# Patient Record
Sex: Female | Born: 1950 | Race: White | Hispanic: No | Smoking: Current every day smoker
Health system: Southern US, Community
[De-identification: ages and names within clinical notes are randomized; demographics above are authoritative.]

## PROBLEM LIST (undated history)

## (undated) DIAGNOSIS — J8410 Pulmonary fibrosis, unspecified: Secondary | ICD-10-CM

## (undated) DIAGNOSIS — M810 Age-related osteoporosis without current pathological fracture: Secondary | ICD-10-CM

---

## 2017-07-16 ENCOUNTER — Emergency Department (HOSPITAL_BASED_OUTPATIENT_CLINIC_OR_DEPARTMENT_OTHER): Payer: Non-veteran care

## 2017-07-16 ENCOUNTER — Encounter (HOSPITAL_BASED_OUTPATIENT_CLINIC_OR_DEPARTMENT_OTHER): Payer: Self-pay | Admitting: Emergency Medicine

## 2017-07-16 ENCOUNTER — Emergency Department (HOSPITAL_BASED_OUTPATIENT_CLINIC_OR_DEPARTMENT_OTHER)
Admission: EM | Admit: 2017-07-16 | Discharge: 2017-07-16 | Disposition: A | Discharge status: Home / Self Care | Payer: Non-veteran care | Attending: Emergency Medicine | Admitting: Emergency Medicine

## 2017-07-16 DIAGNOSIS — S0081XA Abrasion of other part of head, initial encounter: Secondary | ICD-10-CM | POA: Diagnosis not present

## 2017-07-16 DIAGNOSIS — Y929 Unspecified place or not applicable: Secondary | ICD-10-CM | POA: Insufficient documentation

## 2017-07-16 DIAGNOSIS — S80212A Abrasion, left knee, initial encounter: Secondary | ICD-10-CM | POA: Insufficient documentation

## 2017-07-16 DIAGNOSIS — Y999 Unspecified external cause status: Secondary | ICD-10-CM | POA: Insufficient documentation

## 2017-07-16 DIAGNOSIS — S80211A Abrasion, right knee, initial encounter: Secondary | ICD-10-CM | POA: Insufficient documentation

## 2017-07-16 DIAGNOSIS — Y9389 Activity, other specified: Secondary | ICD-10-CM | POA: Insufficient documentation

## 2017-07-16 DIAGNOSIS — R569 Unspecified convulsions: Secondary | ICD-10-CM | POA: Insufficient documentation

## 2017-07-16 DIAGNOSIS — S60511A Abrasion of right hand, initial encounter: Secondary | ICD-10-CM | POA: Insufficient documentation

## 2017-07-16 DIAGNOSIS — F17210 Nicotine dependence, cigarettes, uncomplicated: Secondary | ICD-10-CM | POA: Diagnosis not present

## 2017-07-16 DIAGNOSIS — W01198A Fall on same level from slipping, tripping and stumbling with subsequent striking against other object, initial encounter: Secondary | ICD-10-CM | POA: Diagnosis not present

## 2017-07-16 DIAGNOSIS — S40811A Abrasion of right upper arm, initial encounter: Secondary | ICD-10-CM | POA: Insufficient documentation

## 2017-07-16 DIAGNOSIS — F10920 Alcohol use, unspecified with intoxication, uncomplicated: Secondary | ICD-10-CM

## 2017-07-16 DIAGNOSIS — S0990XA Unspecified injury of head, initial encounter: Secondary | ICD-10-CM

## 2017-07-16 HISTORY — DX: Pulmonary fibrosis, unspecified: J84.10

## 2017-07-16 HISTORY — DX: Age-related osteoporosis without current pathological fracture: M81.0

## 2017-07-16 LAB — CBC WITH DIFFERENTIAL/PLATELET
BASOS ABS: 0 10*3/uL (ref 0.0–0.1)
BASOS PCT: 0 %
EOS ABS: 0.1 10*3/uL (ref 0.0–0.7)
EOS PCT: 1 %
HCT: 35.4 % — ABNORMAL LOW (ref 36.0–46.0)
HEMOGLOBIN: 12.2 g/dL (ref 12.0–15.0)
Lymphocytes Relative: 23 %
Lymphs Abs: 2.2 10*3/uL (ref 0.7–4.0)
MCH: 30.6 pg (ref 26.0–34.0)
MCHC: 34.5 g/dL (ref 30.0–36.0)
MCV: 88.7 fL (ref 78.0–100.0)
MONO ABS: 0.6 10*3/uL (ref 0.1–1.0)
Monocytes Relative: 6 %
Neutro Abs: 6.4 10*3/uL (ref 1.7–7.7)
Neutrophils Relative %: 70 %
Platelets: 239 10*3/uL (ref 150–400)
RBC: 3.99 MIL/uL (ref 3.87–5.11)
RDW: 15.6 % — ABNORMAL HIGH (ref 11.5–15.5)
WBC: 9.2 10*3/uL (ref 4.0–10.5)

## 2017-07-16 LAB — COMPREHENSIVE METABOLIC PANEL
ALT: 20 U/L (ref 14–54)
ANION GAP: 9 (ref 5–15)
AST: 30 U/L (ref 15–41)
Albumin: 4.1 g/dL (ref 3.5–5.0)
Alkaline Phosphatase: 94 U/L (ref 38–126)
BILIRUBIN TOTAL: 0.4 mg/dL (ref 0.3–1.2)
BUN: 7 mg/dL (ref 6–20)
CO2: 25 mmol/L (ref 22–32)
Calcium: 8.5 mg/dL — ABNORMAL LOW (ref 8.9–10.3)
Chloride: 105 mmol/L (ref 101–111)
Creatinine, Ser: 0.77 mg/dL (ref 0.44–1.00)
GFR calc Af Amer: >60 mL/min (ref >60)
Glucose, Bld: 92 mg/dL (ref 65–99)
POTASSIUM: 3.8 mmol/L (ref 3.5–5.1)
SODIUM: 139 mmol/L (ref 135–145)
TOTAL PROTEIN: 7 g/dL (ref 6.5–8.1)

## 2017-07-16 LAB — ETHANOL: Alcohol, Ethyl (B): 148 mg/dL — ABNORMAL HIGH (ref <5)

## 2017-07-16 NOTE — Discharge Instructions (Signed)
Limit your alcohol consumption.  Follow-up with your primary doctor later this week, and return to the ER if symptoms significantly worsen or change.

## 2017-07-16 NOTE — ED Provider Notes (Signed)
[facility]   CSN: [id number] Arrival date & time: 07/16/17  1102     History   Chief Complaint Chief Complaint  Patient presents with  . Fall    HPI Debra Arellano is a 66 y.o. female.  Patient is a [age] year old female with history of pulmonary fibrosis. She presents for evaluation of a fall and possible seizure activity. She was celebrating her birthday yesterday evening and reports consuming several glasses of wine on an empty stomach. Yesterday evening she was outside smoking a cigarette while intoxicated, then fell forward and struck her face on the ground. She required assistance from her daughter to stand and walk back to the house. This morning her daughter was checking on her when she believes she witnessed her mother having a seizure. She had generalized shaking for approximately 30 seconds along with loss of consciousness and urinary incontinence for approximately 10 minutes. The daughter called EMS and the patient was transported here.  The patient replies "I like my wine" when asked how much alcohol she consumes. According to the daughter she is a significant drinker.   The history is provided by the patient.  Fall  This is a new problem. Episode onset: Last night. Episode frequency: Once. The problem has not changed since onset.Nothing aggravates the symptoms. Nothing relieves the symptoms. She has tried nothing for the symptoms.    Past Medical History:  Diagnosis Date  . Osteoporosis    of spine   . Pulmonary fibrosis (HCC)     There are no active problems to display for this patient.   History reviewed. No pertinent surgical history.  OB History    No data available       Home Medications    Prior to Admission medications   Not on File    Family History History reviewed. No pertinent family history.  Social History Social History  Substance Use Topics  . Smoking status: Current Every Day Smoker    Packs/day: 0.25   Years: 40.00    Types: Cigarettes  . Smokeless tobacco: Not on file  . Alcohol use 1.2 oz/week    2 Glasses of wine per week     Comment: 1-2 glasses of red wine daily      Allergies   Patient has no known allergies.   Review of Systems Review of Systems  All other systems reviewed and are negative.    Physical Exam Updated Vital Signs BP 120/77 (BP Location: Right Arm)   Pulse 82   Temp 98.9 F (37.2 C) (Oral)   Resp 18   Ht 5' 4.75" (1.645 m)   Wt 57.6 kg (127 lb)   SpO2 98%   BMI 21.30 kg/m   Physical Exam  Constitutional: She is oriented to person, place, and time. She appears well-developed and well-nourished. No distress.  HENT:  Head: Normocephalic.  There are abrasions to the right supraorbital ridge and cheekbone, however no significant swelling or obvious deformity. TMs are clear bilaterally without hemotympanum.  Eyes: Pupils are equal, round, and reactive to light. EOM are normal.  There is no diplopia on upward case.  Neck: Normal range of motion. Neck supple.  Cardiovascular: Normal rate and regular rhythm.  Exam reveals no gallop and no friction rub.   No murmur heard. Pulmonary/Chest: Effort normal and breath sounds normal. No respiratory distress. She has no wheezes.  Abdominal: Soft. Bowel sounds are normal. She exhibits no distension. There is no tenderness.  Musculoskeletal: Normal range  of motion.  Neurological: She is alert and oriented to person, place, and time. No cranial nerve deficit. She exhibits normal muscle tone. Coordination normal.  Skin: Skin is warm and dry. She is not diaphoretic.  Nursing note and vitals reviewed.    ED Treatments / Results  Labs (all labs ordered are listed, but only abnormal results are displayed) Labs Reviewed  CBC WITH DIFFERENTIAL/PLATELET  COMPREHENSIVE METABOLIC PANEL  ETHANOL    EKG  EKG Interpretation None       Radiology No results found.  Procedures Procedures (including critical  care time)  Medications Ordered in ED Medications - No data to display   Initial Impression / Assessment and Plan / ED Course  I have reviewed the triage vital signs and the nursing notes.  Pertinent labs & imaging results that were available during my care of the patient were reviewed by me and considered in my medical decision making (see chart for details).  Patient brought for evaluation after apparent seizure. She has no history of this, however does have a history of alcohol consumption. She was drinking heavily yesterday evening, then experienced what sounds like a seizure today. She is now wide awake and neurologically intact. Her workup today reveals no acute abnormality with the exception of a blood alcohol of 148. I suspect the patient was quite intoxicated yesterday evening and believe whatever episode happened this morning is related to alcohol. I have advised the patient to refrain from heavy drinking and follow-up with her primary doctor later this week.  Final Clinical Impressions(s) / ED Diagnoses   Final diagnoses:  None    New Prescriptions New Prescriptions   No medications on file     Geoffery Lyonselo, Seri Kimmer, MD 07/16/17 1321

## 2017-07-16 NOTE — ED Triage Notes (Signed)
Per EMS: PT had 4 glasses of wine last night for birthday celebration and was outside and fell flat on face onto concrete.  Pt has bruising to right side of face and abrasion to right hand, bilateral knees and right arm.  Pt daughter gave pt pedialyte this morning and pt "started shaking" for less than 10 seconds and was unconscious for appx 30 seconds.  Pt had episode of incontinence.  Pt alert and oriented at this time.

## 2017-07-16 NOTE — ED Notes (Signed)
Side rails padded with seizure pads.  Pt alert and oriented at this time.

## 2018-12-04 IMAGING — CT CT HEAD W/O CM
3 series · 15 of 47 positions shown, 18 images · non-contrast
Comparison: None.

CLINICAL DATA: Status post fall last night with a blow to the face.
Initial encounter.

EXAM:
CT HEAD WITHOUT CONTRAST
TECHNIQUE: Contiguous axial images were obtained from the base of the skull
through the vertex without intravenous contrast.

[Series 2: head wo · axial · 0.49mm/px · z∈[-168,-43]mm · 9 of 30 slices shown, 12 images]
[im 3/30  brain]
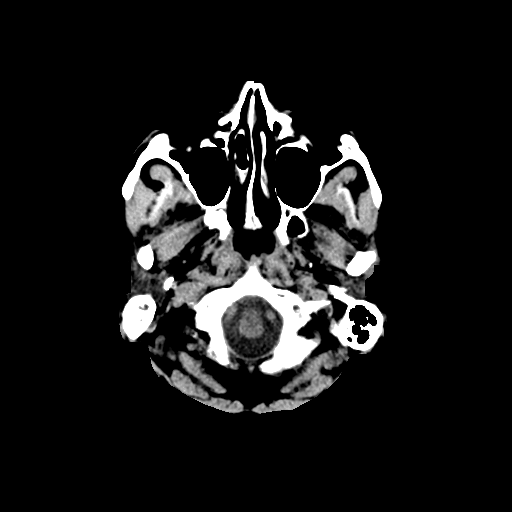
[im 3/30  bone]
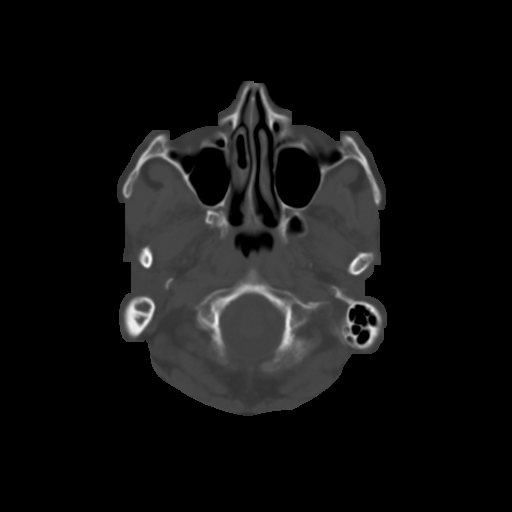
[im 6/30  brain]
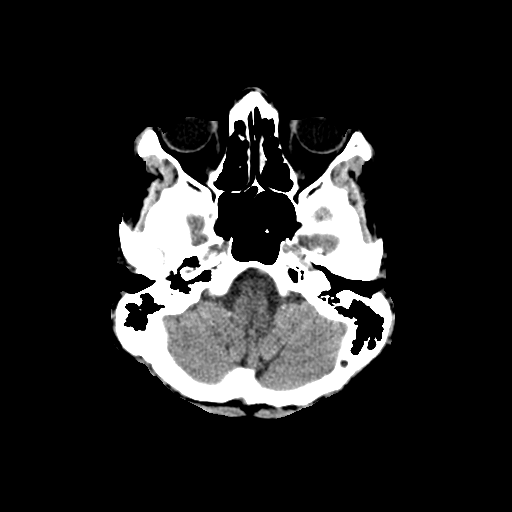
[im 9/30  brain]
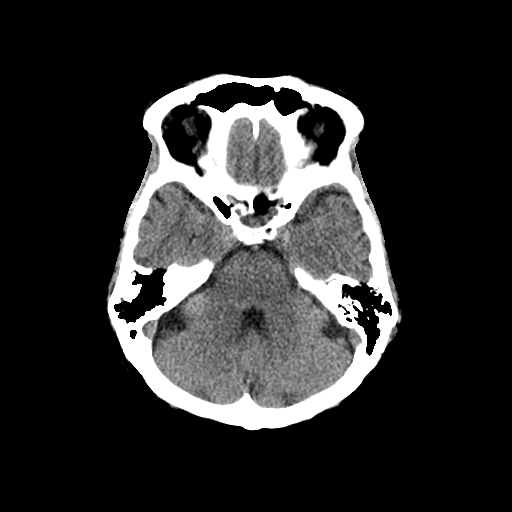
[im 12/30  brain]
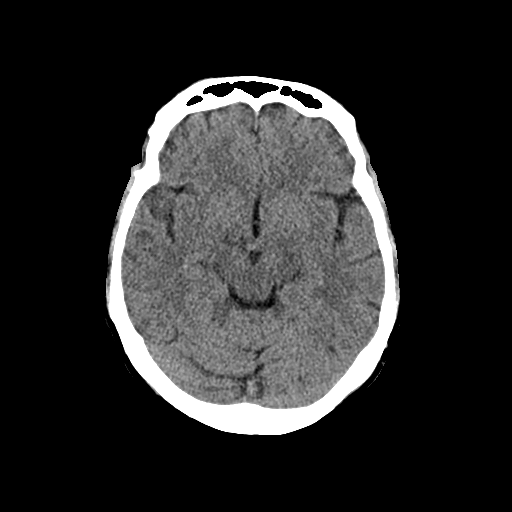
[im 16/30  brain]
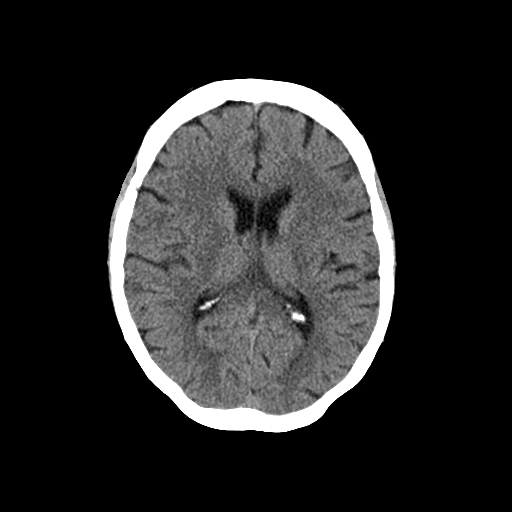
[im 16/30  bone]
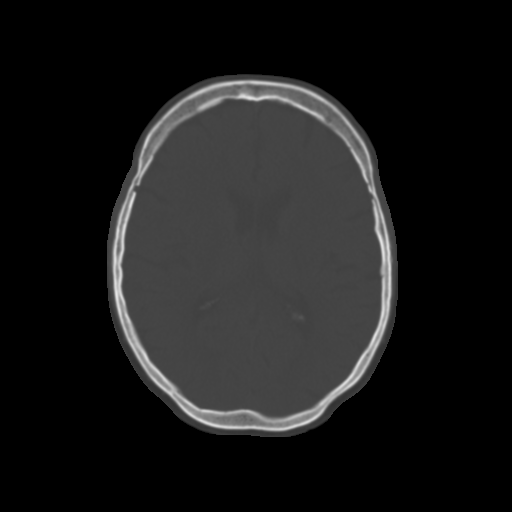
[im 19/30  brain]
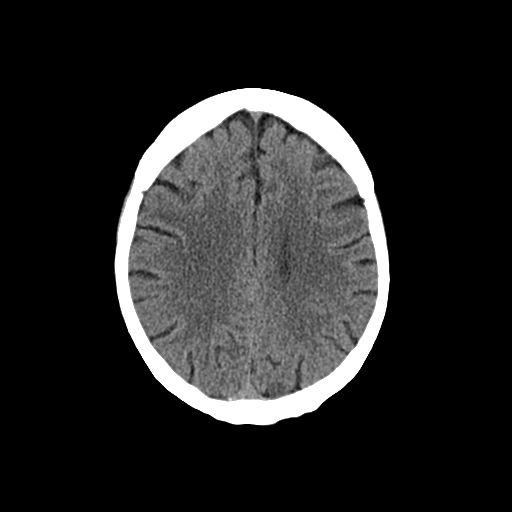
[im 22/30  brain]
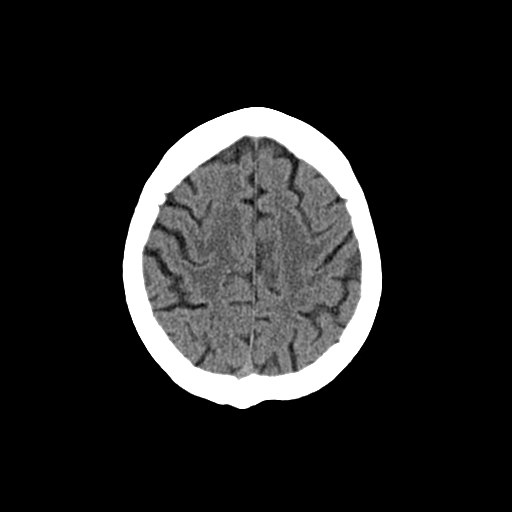
[im 25/30  brain]
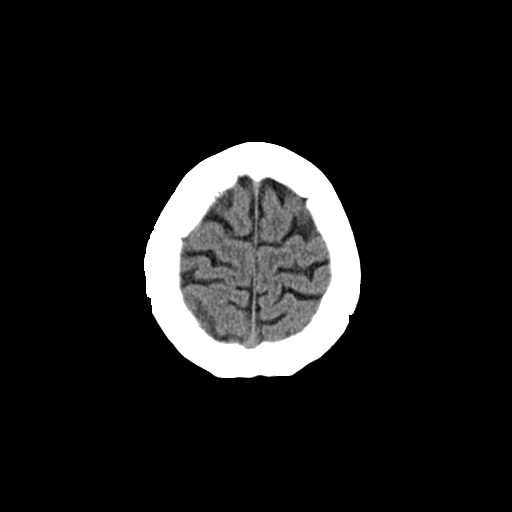
[im 28/30  brain]
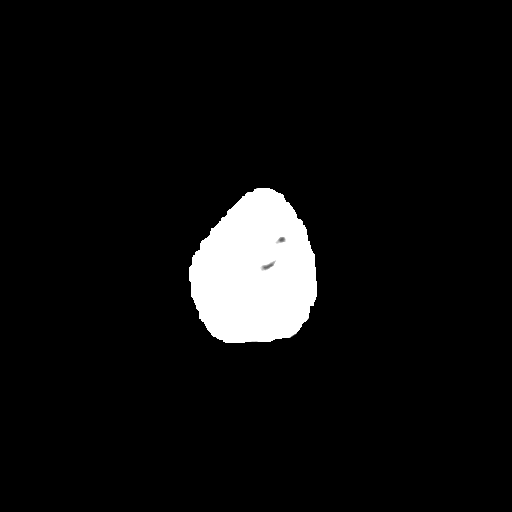
[im 28/30  bone]
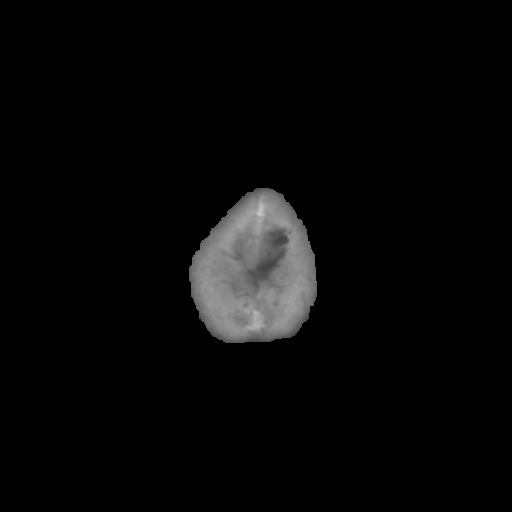

[Series 4: cor soft · coronal · 0.29mm/px · 3 of 60 slices shown]
[im 20/60  brain]
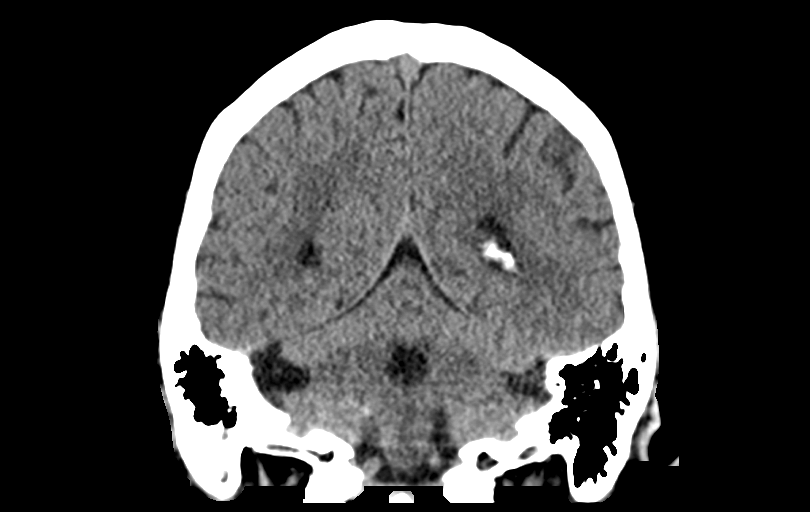
[im 27/60  brain]
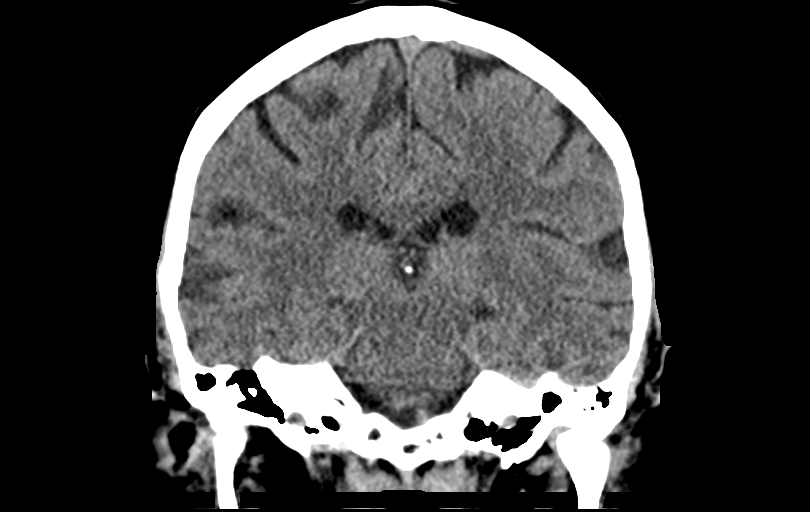
[im 33/60  brain]
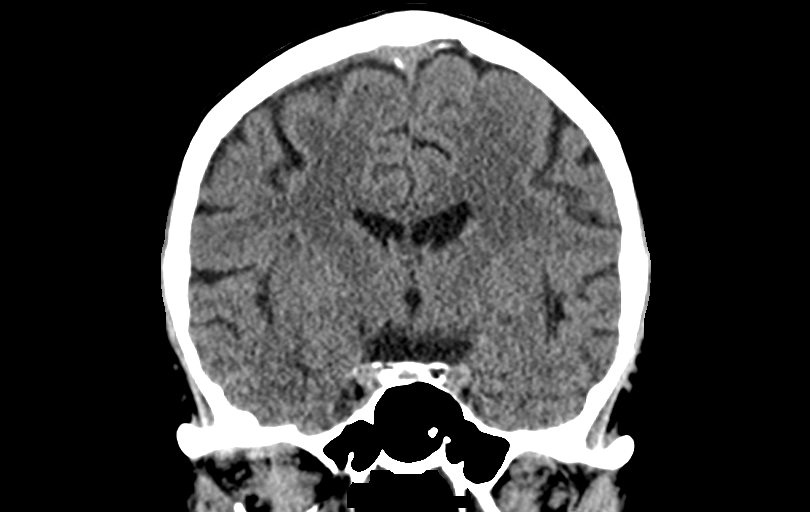

[Series 5: sag soft · sagittal · 0.29mm/px · 3 of 54 slices shown]
[im 18/54  brain]
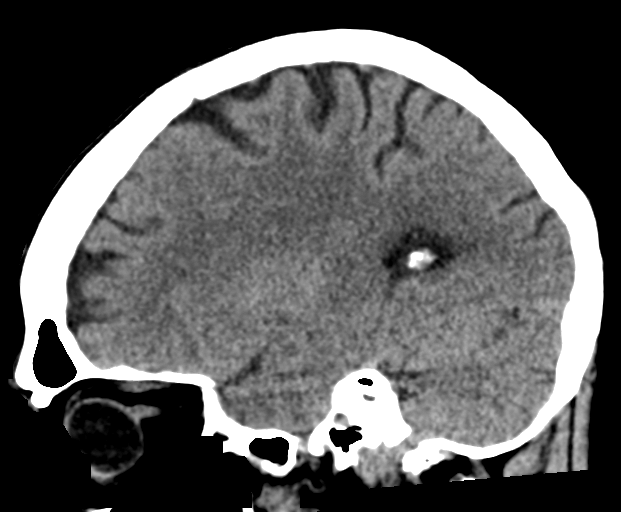
[im 27/54  brain]
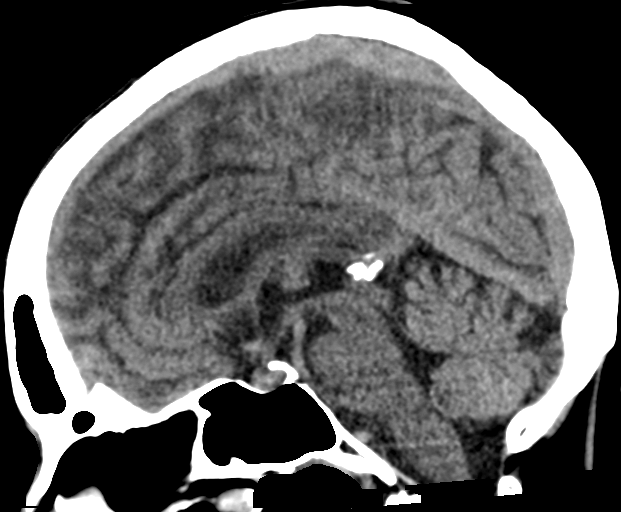
[im 36/54  brain]
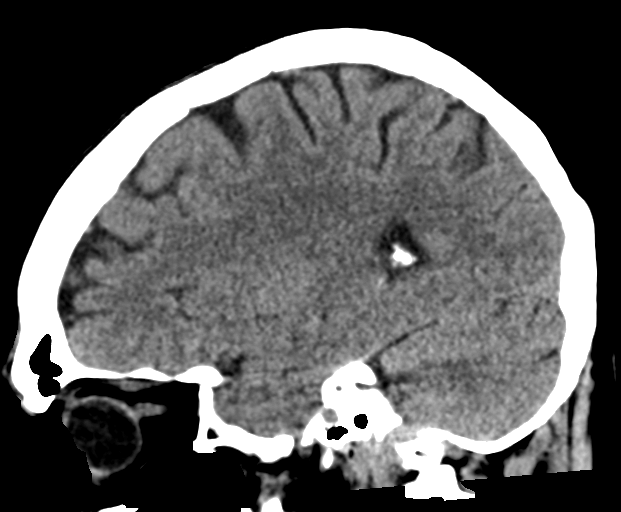

[15 of 47 positions shown; findings below may reference images not displayed]

FINDINGS: Brain: Appears normal without hemorrhage, infarct, mass lesion, mass
effect or midline shift or abnormal extra-axial fluid collection.

Vascular: Negative.

Skull: Intact.

Sinuses/Orbits: Negative.

Other: None.
IMPRESSION: Negative head CT.
